# Patient Record
Sex: Male | Born: 1955
Health system: Southern US, Community
[De-identification: ages and names within clinical notes are randomized; demographics above are authoritative.]

---

## 2004-10-21 ENCOUNTER — Ambulatory Visit (HOSPITAL_COMMUNITY): Admission: RE | Admit: 2004-10-21 | Discharge: 2004-10-21 | Payer: Self-pay | Admitting: Internal Medicine

## 2006-04-29 ENCOUNTER — Encounter: Admission: RE | Admit: 2006-04-29 | Discharge: 2006-04-29 | Payer: Self-pay | Admitting: Internal Medicine

## 2006-10-07 ENCOUNTER — Emergency Department (HOSPITAL_COMMUNITY): Admission: EM | Admit: 2006-10-07 | Discharge: 2006-10-07 | Payer: Self-pay | Admitting: Family Medicine

## 2007-03-23 ENCOUNTER — Encounter: Admission: RE | Admit: 2007-03-23 | Discharge: 2007-03-23 | Payer: Self-pay | Admitting: Occupational Medicine

## 2008-03-11 ENCOUNTER — Emergency Department (HOSPITAL_COMMUNITY): Admission: EM | Admit: 2008-03-11 | Discharge: 2008-03-11 | Payer: Self-pay | Admitting: Emergency Medicine

## 2008-07-08 ENCOUNTER — Emergency Department (HOSPITAL_COMMUNITY): Admission: EM | Admit: 2008-07-08 | Discharge: 2008-07-08 | Payer: Self-pay | Admitting: Family Medicine

## 2008-11-28 ENCOUNTER — Emergency Department (HOSPITAL_COMMUNITY): Admission: EM | Admit: 2008-11-28 | Discharge: 2008-11-29 | Payer: Self-pay | Admitting: Emergency Medicine

## 2009-05-21 ENCOUNTER — Emergency Department (HOSPITAL_COMMUNITY): Admission: EM | Admit: 2009-05-21 | Discharge: 2009-05-21 | Payer: Self-pay | Admitting: Family Medicine

## 2009-06-07 ENCOUNTER — Emergency Department (HOSPITAL_COMMUNITY): Admission: EM | Admit: 2009-06-07 | Discharge: 2009-06-07 | Payer: Self-pay | Admitting: Emergency Medicine

## 2010-08-19 ENCOUNTER — Inpatient Hospital Stay (INDEPENDENT_AMBULATORY_CARE_PROVIDER_SITE_OTHER)
Admission: RE | Admit: 2010-08-19 | Discharge: 2010-08-19 | Disposition: A | Discharge status: Home / Self Care | Payer: 59 | Source: Ambulatory Visit | Attending: Emergency Medicine | Admitting: Emergency Medicine

## 2010-08-19 DIAGNOSIS — M545 Low back pain, unspecified: Secondary | ICD-10-CM

## 2010-08-19 DIAGNOSIS — R040 Epistaxis: Secondary | ICD-10-CM

## 2010-08-19 DIAGNOSIS — R1013 Epigastric pain: Secondary | ICD-10-CM

## 2010-09-21 LAB — URINALYSIS, ROUTINE W REFLEX MICROSCOPIC
Glucose, UA: NEGATIVE mg/dL
Hgb urine dipstick: NEGATIVE
Specific Gravity, Urine: 1.025 (ref 1.005–1.030)
Urobilinogen, UA: 1 mg/dL (ref 0.0–1.0)
pH: 7 (ref 5.0–8.0)

## 2010-09-21 LAB — DIFFERENTIAL
Basophils Absolute: 0 10*3/uL (ref 0.0–0.1)
Basophils Relative: 0 % (ref 0–1)
Eosinophils Absolute: 0 10*3/uL (ref 0.0–0.7)
Eosinophils Relative: 1 % (ref 0–5)
Lymphocytes Relative: 6 % — ABNORMAL LOW (ref 12–46)

## 2010-09-21 LAB — COMPREHENSIVE METABOLIC PANEL
ALT: 21 U/L (ref 0–53)
AST: 32 U/L (ref 0–37)
CO2: 29 mEq/L (ref 19–32)
Chloride: 104 mEq/L (ref 96–112)
Creatinine, Ser: 1.06 mg/dL (ref 0.4–1.5)
GFR calc Af Amer: >60 mL/min (ref >60)
GFR calc non Af Amer: >60 mL/min (ref >60)
Total Bilirubin: 2.7 mg/dL — ABNORMAL HIGH (ref 0.3–1.2)

## 2010-09-21 LAB — CBC
HCT: 42.5 % (ref 39.0–52.0)
MCV: 93.9 fL (ref 78.0–100.0)
RBC: 4.52 MIL/uL (ref 4.22–5.81)
WBC: 5.2 10*3/uL (ref 4.0–10.5)

## 2010-09-30 ENCOUNTER — Inpatient Hospital Stay (INDEPENDENT_AMBULATORY_CARE_PROVIDER_SITE_OTHER)
Admission: RE | Admit: 2010-09-30 | Discharge: 2010-09-30 | Disposition: A | Discharge status: Home / Self Care | Payer: 59 | Source: Ambulatory Visit | Attending: Family Medicine | Admitting: Family Medicine

## 2010-09-30 DIAGNOSIS — L03019 Cellulitis of unspecified finger: Secondary | ICD-10-CM

## 2010-10-02 LAB — CULTURE, ROUTINE-ABSCESS: Gram Stain: NONE SEEN

## 2010-10-05 LAB — POCT URINALYSIS DIP (DEVICE)
Hgb urine dipstick: NEGATIVE
Ketones, ur: NEGATIVE mg/dL
Protein, ur: 30 mg/dL — AB
Specific Gravity, Urine: 1.015 (ref 1.005–1.030)
pH: 7.5 (ref 5.0–8.0)

## 2010-11-06 ENCOUNTER — Ambulatory Visit: Payer: Self-pay

## 2010-11-06 ENCOUNTER — Other Ambulatory Visit: Payer: Self-pay | Admitting: Occupational Medicine

## 2010-11-06 DIAGNOSIS — R52 Pain, unspecified: Secondary | ICD-10-CM

## 2011-03-22 LAB — URINALYSIS, ROUTINE W REFLEX MICROSCOPIC
Bilirubin Urine: NEGATIVE
Glucose, UA: NEGATIVE
Ketones, ur: NEGATIVE
pH: 6

## 2015-03-27 ENCOUNTER — Emergency Department (INDEPENDENT_AMBULATORY_CARE_PROVIDER_SITE_OTHER)
Admission: EM | Admit: 2015-03-27 | Discharge: 2015-03-27 | Disposition: A | Discharge status: Home / Self Care | Payer: PRIVATE HEALTH INSURANCE | Source: Home / Self Care | Attending: Family Medicine | Admitting: Family Medicine

## 2015-03-27 ENCOUNTER — Emergency Department (INDEPENDENT_AMBULATORY_CARE_PROVIDER_SITE_OTHER): Payer: PRIVATE HEALTH INSURANCE

## 2015-03-27 ENCOUNTER — Encounter (HOSPITAL_COMMUNITY): Payer: Self-pay | Admitting: Emergency Medicine

## 2015-03-27 DIAGNOSIS — S93402A Sprain of unspecified ligament of left ankle, initial encounter: Secondary | ICD-10-CM

## 2015-03-27 NOTE — ED Provider Notes (Signed)
CSN: [id number]     Arrival date & time 03/27/15  1640 History   First MD Initiated Contact with Patient 03/27/15 1721     Chief Complaint  Patient presents with  . Ankle Pain   (Consider location/radiation/quality/duration/timing/severity/associated sxs/prior Treatment) HPI Comments: 59 year old male was pulling a linen cart when he twisted his left ankle. He is complaining of pain primarily just posterior and her to the left lateral malleolus. He is bearing full weight. This occurred at 2:30 PM today at work.   History reviewed. No pertinent past medical history. History reviewed. No pertinent past surgical history. No family history on file. Social History  Substance Use Topics  . Smoking status: Never Smoker   . Smokeless tobacco: None  . Alcohol Use: No    Review of Systems  Constitutional: Negative.   Respiratory: Negative.   Gastrointestinal: Negative.   Musculoskeletal: Negative for back pain, joint swelling, neck pain and neck stiffness.  Skin: Negative.   Neurological: Negative.     Allergies  Review of patient's allergies indicates no known allergies.  Home Medications   Prior to Admission medications   Not on File   Meds Ordered and Administered this Visit  Medications - No data to display  BP 135/82 mmHg  Pulse 65  Temp(Src) 97.7 F (36.5 C) (Oral)  Resp 16  SpO2 97% No data found.   Physical Exam  Constitutional: He is oriented to person, place, and time. He appears well-developed and well-nourished. No distress.  Eyes: EOM are normal.  Neck: Normal range of motion. Neck supple.  Cardiovascular: Normal rate.   Pulmonary/Chest: Effort normal. No respiratory distress.  Musculoskeletal: He exhibits no edema.  ROM nl.  Tenderness to the the calcanofibular ligament and tenderness superior to the lateral malleolus No deformity or bony tenderness. Minor pufffiness distal to the lat malleolus. Foot without tenderness or swelling.No tenderness,  swelling, discoloration or pain to the achilles tendon.Plantar flexion and dorsiflexion intact. Distal neurovascular motor sensory is intact pedal pulse 2+.  Neurological: He is alert and oriented to person, place, and time. He exhibits normal muscle tone.  Skin: Skin is warm and dry.  Psychiatric: He has a normal mood and affect.  Nursing note and vitals reviewed.   ED Course  Procedures (including critical care time)  Labs Review Labs Reviewed - No data to display  Imaging Review Dg Ankle Complete Left  03/27/2015   CLINICAL DATA:  Blunt trauma to the posterior heel with pain, initial encounter  EXAM: LEFT ANKLE COMPLETE - 3+ VIEW  COMPARISON:  None.  FINDINGS: No acute fracture or dislocation is noted. Diffuse vascular calcifications are seen. A small Achilles spur is noted.  IMPRESSION: No acute abnormality noted.   Electronically Signed   By: Inez Catalina M.D.   On: 03/27/2015 18:07     Visual Acuity Review  Right Eye Distance:   Left Eye Distance:   Bilateral Distance:    Right Eye Near:   Left Eye Near:    Bilateral Near:         MDM   1. Ankle sprain, left, initial encounter    NO heavy pulling, pushing wt lifting for 3 days. RICE ASO wrap    Janne Napoleon, NP 03/27/15 Bosie Helper  Janne Napoleon, NP 03/27/15 1840

## 2015-03-27 NOTE — ED Notes (Signed)
Patient reports getting hurt at work.  Linen cart run over the side of his feet, including the ankle

## 2015-03-27 NOTE — Discharge Instructions (Signed)
Ankle Sprain  An ankle sprain is an injury to the strong, fibrous tissues (ligaments) that hold the bones of your ankle joint together.   CAUSES  An ankle sprain is usually caused by a fall or by twisting your ankle. Ankle sprains most commonly occur when you step on the outer edge of your foot, and your ankle turns inward. People who participate in sports are more prone to these types of injuries.   SYMPTOMS    Pain in your ankle. The pain may be present at rest or only when you are trying to stand or walk.   Swelling.   Bruising. Bruising may develop immediately or within 1 to 2 days after your injury.   Difficulty standing or walking, particularly when turning corners or changing directions.  DIAGNOSIS   Your caregiver will ask you details about your injury and perform a physical exam of your ankle to determine if you have an ankle sprain. During the physical exam, your caregiver will press on and apply pressure to specific areas of your foot and ankle. Your caregiver will try to move your ankle in certain ways. An X-ray exam may be done to be sure a bone was not broken or a ligament did not separate from one of the bones in your ankle (avulsion fracture).   TREATMENT   Certain types of braces can help stabilize your ankle. Your caregiver can make a recommendation for this. Your caregiver may recommend the use of medicine for pain. If your sprain is severe, your caregiver may refer you to a surgeon who helps to restore function to parts of your skeletal system (orthopedist) or a physical therapist.  HOME CARE INSTRUCTIONS    Apply ice to your injury for 1-2 days or as directed by your caregiver. Applying ice helps to reduce inflammation and pain.    Put ice in a plastic bag.    Place a towel between your skin and the bag.    Leave the ice on for 15-20 minutes at a time, every 2 hours while you are awake.   Only take over-the-counter or prescription medicines for pain, discomfort, or fever as directed by  your caregiver.   Elevate your injured ankle above the level of your heart as much as possible for 2-3 days.   If your caregiver recommends crutches, use them as instructed. Gradually put weight on the affected ankle. Continue to use crutches or a cane until you can walk without feeling pain in your ankle.   If you have a plaster splint, wear the splint as directed by your caregiver. Do not rest it on anything harder than a pillow for the first 24 hours. Do not put weight on it. Do not get it wet. You may take it off to take a shower or bath.   You may have been given an elastic bandage to wear around your ankle to provide support. If the elastic bandage is too tight (you have numbness or tingling in your foot or your foot becomes cold and blue), adjust the bandage to make it comfortable.   If you have an air splint, you may blow more air into it or let air out to make it more comfortable. You may take your splint off at night and before taking a shower or bath. Wiggle your toes in the splint several times per day to decrease swelling.  SEEK MEDICAL CARE IF:    You have rapidly increasing bruising or swelling.   Your toes feel   extremely cold or you lose feeling in your foot.   Your pain is not relieved with medicine.  SEEK IMMEDIATE MEDICAL CARE IF:   Your toes are numb or blue.   You have severe pain that is increasing.  MAKE SURE YOU:    Understand these instructions.   Will watch your condition.   Will get help right away if you are not doing well or get worse.     This information is not intended to replace advice given to you by your health care provider. Make sure you discuss any questions you have with your health care provider.     Document Released: 06/07/2005 Document Revised: 06/28/2014 Document Reviewed: 06/19/2011  Elsevier Interactive Patient Education 2016 [employer]

## 2015-05-12 ENCOUNTER — Ambulatory Visit: Payer: PRIVATE HEALTH INSURANCE | Attending: Orthopedic Surgery | Admitting: Physical Therapy

## 2015-05-28 ENCOUNTER — Ambulatory Visit: Payer: PRIVATE HEALTH INSURANCE | Attending: Orthopedic Surgery | Admitting: Physical Therapy

## 2015-05-28 DIAGNOSIS — M25473 Effusion, unspecified ankle: Secondary | ICD-10-CM

## 2015-05-28 DIAGNOSIS — R29898 Other symptoms and signs involving the musculoskeletal system: Secondary | ICD-10-CM | POA: Diagnosis present

## 2015-05-28 DIAGNOSIS — M6281 Muscle weakness (generalized): Secondary | ICD-10-CM | POA: Insufficient documentation

## 2015-05-28 DIAGNOSIS — M25572 Pain in left ankle and joints of left foot: Secondary | ICD-10-CM | POA: Insufficient documentation

## 2015-05-28 DIAGNOSIS — R609 Edema, unspecified: Secondary | ICD-10-CM | POA: Diagnosis present

## 2015-05-28 DIAGNOSIS — R269 Unspecified abnormalities of gait and mobility: Secondary | ICD-10-CM | POA: Insufficient documentation

## 2015-05-28 DIAGNOSIS — R531 Weakness: Secondary | ICD-10-CM

## 2015-05-28 DIAGNOSIS — M25673 Stiffness of unspecified ankle, not elsewhere classified: Secondary | ICD-10-CM

## 2015-05-28 NOTE — Therapy (Signed)
[place], [place], [address] Phone: [phone]   Fax:  [phone]  Physical Therapy Evaluation  Patient Details  Name: David Waters MRN: [id number] Date of Birth: Feb 08, 1956 Referring Provider: Kathalene Frames. Mayer Camel, MD  Encounter Date: 05/28/2015      PT End of Session - 05/28/15 1657    Visit Number 1   Number of Visits 12   Date for PT Re-Evaluation 07/23/15   PT Start Time 1603   PT Stop Time 1649   PT Time Calculation (min) 46 min   Activity Tolerance Patient tolerated treatment well   Behavior During Therapy [facility] for tasks assessed/performed      History reviewed. No pertinent past medical history.  History reviewed. No pertinent past surgical history.  There were no vitals filed for this visit.  Visit Diagnosis:  Decreased range of motion of ankle - Plan: PT plan of care cert/re-cert  Decreased strength - Plan: PT plan of care cert/re-cert  Abnormality of gait - Plan: PT plan of care cert/re-cert  Pain in joint, ankle and foot, left - Plan: PT plan of care cert/re-cert  Ankle edema - Plan: PT plan of care cert/re-cert      Subjective Assessment - 05/28/15 1611    Subjective At work was bringing down a cart of dirty laundry which rolled over his Lt ankle (March 26, 2014). Did get X-rays which did not show any breaks. Off work for now but will go back to the doctor on 06/24/15.    Limitations Standing   How long can you sit comfortably? unlimited   How long can you stand comfortably? 1 hour   How long can you walk comfortably? 1 hour   Diagnostic tests X-rays, negative per patient report   Patient Stated Goals get back to work and walking.    Currently in Pain? Yes   Pain Score 2    Pain Location Ankle   Pain Orientation Left   Pain Descriptors / Indicators Sore   Pain Type Chronic pain   Pain Radiating Towards none   Pain Onset More than a month ago   Pain Frequency Constant   Aggravating Factors  Lying on Lt side, standing for long time.   Pain Relieving Factors nothing            [facility] PT Assessment - 05/28/15 0001    Assessment   Medical Diagnosis Left ankle and heel pain   Referring Provider Kathalene Frames. Mayer Camel, MD   Onset Date/Surgical Date 03/27/15   Next MD Visit 06/24/15   Prior Therapy none   Precautions   Precautions None   Restrictions   Weight Bearing Restrictions No   Balance Screen   Has the patient fallen in the past 6 months No   [place] residence   Living Arrangements Other (Comment)  family   Prior Function   Level of Independence Independent   Vocation Full time employment   Vocation Requirements Walking, pushing/pulling   Cognition   Overall Cognitive Status Within Functional Limits for tasks assessed   Observation/Other Assessments   Observations Using CAM boot, pes planus on Lt   Observation/Other Assessments-Edema    Edema --  mild at posterior malleolus   Sensation   Light Touch Appears Intact   AROM   Left Ankle Dorsiflexion 3   Left Ankle Plantar Flexion 22   Left Ankle Inversion 24   Left Ankle Eversion 4  pain  Strength   Left Ankle Dorsiflexion 4+/5   Left Ankle Plantar Flexion 4/5   Left Ankle Inversion 4-/5   Left Ankle Eversion 4-/5   Palpation   Palpation comment Tender at posterior malleolus, no discomfort at anterior aspect or achilles   Ambulation/Gait   Ambulation/Gait Yes   Ambulation/Gait Assistance 6: Modified independent (Device/Increase time);Other (comment)  CAM boot   Ambulation Distance (Feet) 100 Feet   Assistive device None   Gait Pattern Decreased weight shift to left;Decreased stance time - left   Ambulation Surface Level                   [facility] Adult PT Treatment/Exercise - 05/28/15 0001    Ankle Exercises: Stretches   Gastroc Stretch 5 reps;20 seconds   Other Stretch active plantar flexion, eversion, inversion, Lt 1X10 each  direction.                 PT Education - 05/28/15 1656    Education provided Yes   Education Details General POC and HEP.    Person(s) Educated Patient   Methods Explanation;Demonstration;Tactile cues;Verbal cues;Handout   Comprehension Verbalized understanding;Returned demonstration          PT Short Term Goals - 05/28/15 1710    PT SHORT TERM GOAL #1   Title Patient to be independent with HEP for ROM and strengtheinging for LLE.    Time 3   Period Weeks   Status New   PT SHORT TERM GOAL #2   Title Patient to demonstrate 10 degrees of dorsiflexion on the Lt. for gait mechanics.    Time 3   Period Weeks   Status New   PT SHORT TERM GOAL #3   Title Patient to rate his pain as less than or equal to 1/10 with activity.    Time 3   Period Weeks   Status New           PT Long Term Goals - 05/28/15 1705    PT LONG TERM GOAL #1   Title Patient to ambulate without boot and without gait deviation for return to work.    Time 6   Period Weeks   Status New   PT LONG TERM GOAL #2   Title Patient to demonstrate 5/5 strength throughout Lt ankle in all planes for stability on uneven surfaces.    Time 6   Period Weeks   Status New   PT LONG TERM GOAL #3   Title Patient to reports ability to return to work without restrictions.    Time 6   Period Weeks   Status New   PT LONG TERM GOAL #4   Title Patient to demonstrate 15 degrees Lt ankle dorsiflexion for proper gait mechanics and ankle arthrokinematics.    Time 6   Period Weeks   Status New   PT LONG TERM GOAL #5   Period Weeks               Plan - 05/28/15 1658    Clinical Impression Statement Patient is a 59 y.o. male who describes rolling over his left ankle while moving a laundry cart on 03/27/15. At this time the patient is presenting with decreased Lt ankle ROM as well as decreased pain and pain at the lateral ankle. Functionally this is impacting his ability to stand and ambulate which has also  restricted him from returning to work. The patient is appropriate for ongoing PT sessoins to address these areas for incresed  function and return to work.    Pt will benefit from skilled therapeutic intervention in order to improve on the following deficits Abnormal gait;Decreased activity tolerance;Decreased balance;Decreased range of motion;Difficulty walking;Decreased mobility;Decreased strength   Rehab Potential Good   PT Frequency 2x / week   PT Duration 6 weeks   PT Treatment/Interventions ADLs/Self Care Home Management;Iontophoresis 4mg /ml Dexamethasone;Moist Heat;Balance training;Therapeutic exercise;Therapeutic activities;Functional mobility training;Gait training;Stair training;Ultrasound;Patient/family education;Manual techniques;Passive range of motion;Vasopneumatic Device   PT Next Visit Plan Review HEP and progress to gently isometrics if appropriate. Work on stretches for dorsiflexion and active motion into EV/IV.    PT Home Exercise Plan Assess response to isometrics, add if appropriate   Consulted and Agree with Plan of Care Patient         Problem List There are no active problems to display for this patient.   Linard Millers, PT 05/28/2015, 5:17 PM  [facility] [address] [place], [place], [address] Phone: [phone]   Fax:  [phone]  Name: DAVIDE HENSLEE MRN: [id number] Date of Birth: 06-10-1956

## 2015-06-04 ENCOUNTER — Ambulatory Visit: Payer: PRIVATE HEALTH INSURANCE | Attending: Orthopedic Surgery | Admitting: Physical Therapy

## 2015-06-04 DIAGNOSIS — R29898 Other symptoms and signs involving the musculoskeletal system: Secondary | ICD-10-CM | POA: Insufficient documentation

## 2015-06-04 DIAGNOSIS — R531 Weakness: Secondary | ICD-10-CM

## 2015-06-04 DIAGNOSIS — M25572 Pain in left ankle and joints of left foot: Secondary | ICD-10-CM | POA: Diagnosis present

## 2015-06-04 DIAGNOSIS — M25673 Stiffness of unspecified ankle, not elsewhere classified: Secondary | ICD-10-CM

## 2015-06-04 DIAGNOSIS — R269 Unspecified abnormalities of gait and mobility: Secondary | ICD-10-CM | POA: Insufficient documentation

## 2015-06-04 DIAGNOSIS — R609 Edema, unspecified: Secondary | ICD-10-CM | POA: Diagnosis present

## 2015-06-04 DIAGNOSIS — M25473 Effusion, unspecified ankle: Secondary | ICD-10-CM

## 2015-06-04 DIAGNOSIS — M6281 Muscle weakness (generalized): Secondary | ICD-10-CM | POA: Insufficient documentation

## 2015-06-04 NOTE — Therapy (Signed)
[place], [place], [address] Phone: [phone]   Fax:  [phone]  Physical Therapy Treatment  Patient Details  Name: David Waters MRN: [id number] Date of Birth: 1956/03/30 Referring Provider: Kathalene Frames. Mayer Camel, MD  Encounter Date: 06/04/2015      PT End of Session - 06/04/15 1545    Visit Number 2   Number of Visits 12   Date for PT Re-Evaluation 07/23/15   PT Start Time 0343   PT Stop Time 0423   PT Time Calculation (min) 40 min      No past medical history on file.  No past surgical history on file.  There were no vitals filed for this visit.  Visit Diagnosis:  Decreased range of motion of ankle  Decreased strength  Abnormality of gait  Pain in joint, ankle and foot, left  Ankle edema      Subjective Assessment - 06/04/15 1544    Subjective The pain is going away now.    Currently in Pain? No/denies            [facility] PT Assessment - 06/04/15 1554    AROM   Left Ankle Dorsiflexion 10   Left Ankle Plantar Flexion 50                     [facility] Adult PT Treatment/Exercise - 06/04/15 1558    Modalities   Modalities Iontophoresis   Iontophoresis   Type of Iontophoresis Dexamethasone   Location L lateral ankle    Dose 1.0 cc   Time 4-6 hours   Ankle Exercises: Stretches   Gastroc Stretch 2 reps;30 seconds  towel stretch   Ankle Exercises: Seated   Ankle Circles/Pumps 10 reps   Heel Raises 20 reps   Toe Raise 20 reps   Other Seated Ankle Exercises AROM Inv/EV x10 x 3-began to increase pain   Other Seated Ankle Exercises isometric 4 way ankle with good strength noted in PF and DF   Ankle Exercises: Supine   T-Band yellow band for PF/DF   Other Supine Ankle Exercises isometrics for EV/Inv  seated and supine for variation on HEP                PT Education - 06/04/15 1605    Education provided Yes   Education Details Ankel isometrics for EV/INV;  yellow band for PF/DF; Iontophoresis patch-remove if bothersome    Person(s) Educated Patient   Methods Explanation;Handout   Comprehension Verbalized understanding          PT Short Term Goals - 06/04/15 1546    PT SHORT TERM GOAL #1   Title Patient to be independent with HEP for ROM and strengtheinging for LLE.    Time 3   Period Weeks   Status On-going   PT SHORT TERM GOAL #2   Title Patient to demonstrate 10 degrees of dorsiflexion on the Lt. for gait mechanics.    Time 3   Period Weeks   Status On-going   PT SHORT TERM GOAL #3   Title Patient to rate his pain as less than or equal to 1/10 with activity.    Time 3   Period Weeks   Status On-going           PT Long Term Goals - 05/28/15 1705    PT LONG TERM GOAL #1   Title Patient to ambulate without boot and without gait deviation for return to work.  Time 6   Period Weeks   Status New   PT LONG TERM GOAL #2   Title Patient to demonstrate 5/5 strength throughout Lt ankle in all planes for stability on uneven surfaces.    Time 6   Period Weeks   Status New   PT LONG TERM GOAL #3   Title Patient to reports ability to return to work without restrictions.    Time 6   Period Weeks   Status New   PT LONG TERM GOAL #4   Title Patient to demonstrate 15 degrees Lt ankle dorsiflexion for proper gait mechanics and ankle arthrokinematics.    Time 6   Period Weeks   Status New   PT LONG TERM GOAL #5   Period Weeks               Plan - 06/04/15 1623    Clinical Impression Statement Pt presents with reports of decreased pain since beginning HEP. Pain begins with repetitive Inv/Eversion AROM. Instructed pt in isometrics for HEP as well as yellow band for PF/DF. Pt's DF and PF have improved. Will measure In/ev next visit which have visually improved as well. Began Iontophoresis to lateral ankle at point of tenderness as POC signed by MD.    PT Next Visit Plan Review new HEP, cont Ionto if tolerated.          Problem List There are no active problems to display for this patient.   Dorene Ar, [place]  06/04/2015, 4:27 PM  [place], [place], [address] Phone: [phone]   Fax:  [phone]  Name: JOSMAR SAWIN MRN: [id number] Date of Birth: January 01, 1956

## 2015-06-04 NOTE — Patient Instructions (Addendum)
  Inversion (Isometric)    Sitting, press inside of right foot against stable furniture, without ankle movement. Hold 5 sec Repeat _15___ times. Do _2___ sessions per day.  http://gt2.exer.us/433   Copyright  VHI. All rights reserved.   Eversion: Isometric    Press outer border of right foot into ball or rolled pillow against wall. Hold __5__ seconds. Relax. Repeat __15__ times per set. Do __2__ sets per session. Do _2___ sessions per day.  http://orth.exer.us/4   Copyright  VHI. All rights reserved.   Plantar Flexion: Resisted   Anchor behind, tubing around left foot, press down. Repeat _15___ times per set. Do _2___ sets per session. Do __2__ sessions per day.  http://orth.exer.us/10   Copyright  VHI. All rights reserved.  Dorsiflexion: Resisted   Facing anchor, tubing around left foot, pull toward face.  Repeat __15__ times per set. Do __2__ sets per session. Do _2___ sessions per day.  http://orth.exer.us/8   Copyright  VHI. All rights reserved.  IONTOPHORESIS PATIENT PRECAUTIONS & CONTRAINDICATIONS:  . Redness under one or both electrodes can occur.  This characterized by a uniform redness that usually disappears within 12 hours of treatment. . Small pinhead size blisters may result in response to the drug.  Contact your physician if the problem persists more than 24 hours. . On rare occasions, iontophoresis therapy can result in temporary skin reactions such as rash, inflammation, irritation or burns.  The skin reactions may be the result of individual sensitivity to the ionic solution used, the condition of the skin at the start of treatment, reaction to the materials in the electrodes, allergies or sensitivity to dexamethasone, or a poor connection between the patch and your skin.  Discontinue using iontophoresis if you have any of these reactions and report to your therapist. . Remove the Patch or electrodes if you have any undue sensation of pain or burning  during the treatment and report discomfort to your therapist. . Tell your Therapist if you have had known adverse reactions to the application of electrical current. . If using the Patch, the LED light will turn off when treatment is complete and the patch can be removed.  Approximate treatment time is 1-3 hours.  Remove the patch when light goes off or after 6 hours. . The Patch can be worn during normal activity, however excessive motion where the electrodes have been placed can cause poor contact between the skin and the electrode or uneven electrical current resulting in greater risk of skin irritation. [place] [facility] Keep out of the reach of children.   . DO NOT use if you have a cardiac pacemaker or any other electrically sensitive implanted device. . DO NOT use if you have a known sensitivity to dexamethasone. . DO NOT use during Magnetic Resonance Imaging (MRI). . DO NOT use over broken or compromised skin (e.g. sunburn, cuts, or acne) due to the increased risk of skin reaction. . DO NOT SHAVE over the area to be treated:  To establish good contact between the Patch and the skin, excessive hair may be clipped. . DO NOT place the Patch or electrodes on or over your eyes, directly over your heart, or brain. . DO NOT reuse the Patch or electrodes as this may cause burns to occur.

## 2015-06-05 ENCOUNTER — Ambulatory Visit: Payer: PRIVATE HEALTH INSURANCE | Admitting: Physical Therapy

## 2015-06-05 DIAGNOSIS — M25572 Pain in left ankle and joints of left foot: Secondary | ICD-10-CM

## 2015-06-05 DIAGNOSIS — R531 Weakness: Secondary | ICD-10-CM

## 2015-06-05 DIAGNOSIS — R29898 Other symptoms and signs involving the musculoskeletal system: Secondary | ICD-10-CM | POA: Diagnosis not present

## 2015-06-05 DIAGNOSIS — R269 Unspecified abnormalities of gait and mobility: Secondary | ICD-10-CM

## 2015-06-05 DIAGNOSIS — M25473 Effusion, unspecified ankle: Secondary | ICD-10-CM

## 2015-06-05 DIAGNOSIS — M25673 Stiffness of unspecified ankle, not elsewhere classified: Secondary | ICD-10-CM

## 2015-06-05 NOTE — Therapy (Signed)
[place], [place], [address] Phone: [phone]   Fax:  [phone]  Physical Therapy Treatment  Patient Details  Name: David Waters MRN: [id number] Date of Birth: [birth year] Referring Leonda Cristo: Kathalene Frames. Mayer Camel, MD  Encounter Date: 06/05/2015      PT End of Session - 06/05/15 1654    Visit Number 3   Number of Visits 12   Date for PT Re-Evaluation 07/23/15   PT Start Time 0350   PT Stop Time 0430   PT Time Calculation (min) 40 min      No past medical history on file.  No past surgical history on file.  There were no vitals filed for this visit.  Visit Diagnosis:  Decreased range of motion of ankle  Decreased strength  Abnormality of gait  Pain in joint, ankle and foot, left  Ankle edema      Subjective Assessment - 06/05/15 1719    Subjective No pain.    Currently in Pain? No/denies                         [facility] Adult PT Treatment/Exercise - 06/05/15 1649    Modalities   Modalities Iontophoresis   Iontophoresis   Type of Iontophoresis Dexamethasone   Location L lateral ankle    Dose 1.0 cc   Time 4-6 hours   Ankle Exercises: Stretches   Gastroc Stretch 2 reps;30 seconds  towel stretch   Ankle Exercises: Seated   Ankle Circles/Pumps 10 reps   Heel Raises 20 reps   Toe Raise 20 reps   BAPS Sitting;Level 2  PF/DF circles each way no pain   Other Seated Ankle Exercises AROM INV/EV -no pain today   Other Seated Ankle Exercises isometrics 5 sec x 10 INv/ev -no pain   Ankle Exercises: Supine   T-Band yellow band 4 way and green band PF/DF                PT Education - 06/04/15 1605    Education provided Yes   Education Details Ankel isometrics for EV/INV; yellow band for PF/DF; Iontophoresis patch-remove if bothersome    Person(s) Educated Patient   Methods Explanation;Handout   Comprehension Verbalized understanding          PT Short Term  Goals - 06/04/15 1546    PT SHORT TERM GOAL #1   Title Patient to be independent with HEP for ROM and strengtheinging for LLE.    Time 3   Period Weeks   Status On-going   PT SHORT TERM GOAL #2   Title Patient to demonstrate 10 degrees of dorsiflexion on the Lt. for gait mechanics.    Time 3   Period Weeks   Status On-going   PT SHORT TERM GOAL #3   Title Patient to rate his pain as less than or equal to 1/10 with activity.    Time 3   Period Weeks   Status On-going           PT Long Term Goals - 05/28/15 1705    PT LONG TERM GOAL #1   Title Patient to ambulate without boot and without gait deviation for return to work.    Time 6   Period Weeks   Status New   PT LONG TERM GOAL #2   Title Patient to demonstrate 5/5 strength throughout Lt ankle in all planes for stability on uneven surfaces.    Time  6   Period Weeks   Status New   PT LONG TERM GOAL #3   Title Patient to reports ability to return to work without restrictions.    Time 6   Period Weeks   Status New   PT LONG TERM GOAL #4   Title Patient to demonstrate 15 degrees Lt ankle dorsiflexion for proper gait mechanics and ankle arthrokinematics.    Time 6   Period Weeks   Status New   PT LONG TERM GOAL #5   Period Weeks               Plan - 06/05/15 1716    Clinical Impression Statement Pt reports decreased pain today after last treatment and wearing ionto patch last evening. No increased pain with repeated AROM motions. Progressed to seated Baps and resisted 4 way ankle without increased pain. Pt given green band for PF/DF- will begin yellow band for Inversion/eversion.    PT Next Visit Plan continue IONTO, progress inv/evsion exercises as tolerated, measure inv/ev        Problem List There are no active problems to display for this patient.   Dorene Ar, [place] 06/05/2015, 5:19 PM  [place], [place],  [address] Phone: [phone]   Fax:  [phone]  Name: David Waters MRN: [id number] Date of Birth: [birth year]

## 2015-06-09 ENCOUNTER — Ambulatory Visit: Payer: PRIVATE HEALTH INSURANCE | Admitting: Physical Therapy

## 2015-06-09 DIAGNOSIS — R531 Weakness: Secondary | ICD-10-CM

## 2015-06-09 DIAGNOSIS — M25473 Effusion, unspecified ankle: Secondary | ICD-10-CM

## 2015-06-09 DIAGNOSIS — M25572 Pain in left ankle and joints of left foot: Secondary | ICD-10-CM

## 2015-06-09 DIAGNOSIS — R29898 Other symptoms and signs involving the musculoskeletal system: Secondary | ICD-10-CM | POA: Diagnosis not present

## 2015-06-09 DIAGNOSIS — R269 Unspecified abnormalities of gait and mobility: Secondary | ICD-10-CM

## 2015-06-09 DIAGNOSIS — M25673 Stiffness of unspecified ankle, not elsewhere classified: Secondary | ICD-10-CM

## 2015-06-09 NOTE — Patient Instructions (Signed)
Gastroc / Heel Cord Stretch - On Step    Stand with heels over edge of stair. Holding rail, lower heels until stretch is felt in calf of legs for 30 sec Repeat _3__ times. Do __3_ times per day.  Copyright  VHI. All rights reserved.   PLANTARFLEXION STRENGTHENING:   Heel Raise: Bilateral (Standing)   Rise on balls of feet. Repeat _10___ times per set. Do _2___ sets per session. Do _2___ sessions per day.        DORSIFLEXION STRENGTHENING:  Toe Raise (Standing)   Rock back on heels. Repeat ____ times per set. Do ____ sets per session. Do ____ sessions per day.  http://orth.exer.us/42    SINGLE LIMB STANCE   Stance: single leg on floor. Raise leg. Hold __60_ seconds.  _2__ reps per set, _1__ sets per day, __7_ days per week  Copyright  VHI. All rights reserved.   Supine Hamstring Stretch    Lift one leg, placing hands behind thigh. Gently pull leg toward torso until a stretch is felt. Other leg is bent and back is flat against floor. Repeat with other leg. Hold 3 seconds each Repeat _3___ times. Do _3___ sessions per day.  http://gt2.exer.us/828   Copyright  VHI. All rights reserved.

## 2015-06-09 NOTE — Therapy (Signed)
[place], [place], [address] Phone: [phone]   Fax:  [phone]  Physical Therapy Treatment  Patient Details  Name: David Waters MRN: [id number] Date of Birth: [birth year] Referring Provider: Kathalene Frames. Mayer Camel, MD  Encounter Date: 06/09/2015      PT End of Session - 06/09/15 1148    Visit Number 4   Number of Visits 12   Date for PT Re-Evaluation 07/23/15   PT Start Time 3474   PT Stop Time 1225   PT Time Calculation (min) 40 min      No past medical history on file.  No past surgical history on file.  There were no vitals filed for this visit.  Visit Diagnosis:  Decreased range of motion of ankle  Decreased strength  Abnormality of gait  Pain in joint, ankle and foot, left  Ankle edema      Subjective Assessment - 06/09/15 1147    Subjective This is my first day without the boot. I saw the employee heath MD Friday and they said to take the boot off starting today.    Currently in Pain? No/denies            [facility] PT Assessment - 06/09/15 1155    ROM / Strength   AROM / PROM / Strength AROM   AROM   AROM Assessment Site Ankle   Right/Left Ankle Right   Right Ankle Dorsiflexion 15   Right Ankle Plantar Flexion 55   Right Ankle Inversion 30   Right Ankle Eversion 26   Left Ankle Dorsiflexion 10   Left Ankle Plantar Flexion 55   Left Ankle Inversion 30   Left Ankle Eversion 20                     [facility] Adult PT Treatment/Exercise - 06/09/15 1155    Ambulation/Gait   Ambulation/Gait Yes   Ambulation/Gait Assistance 7: Independent   Ambulation Distance (Feet) 200 Feet   Assistive device None   Gait Pattern Step-through pattern   Ambulation Surface Level;Indoor   Stairs Yes   Stairs Assistance 7: Independent   Stair Management Technique Alternating pattern   Number of Stairs 12   Height of Stairs 6   Knee/Hip Exercises: Stretches   Active Hamstring  Stretch 3 reps;30 seconds   Ankle Exercises: Stretches   Gastroc Stretch 2 reps;30 seconds  ball of left foot on 2 inch step    Ankle Exercises: Standing   SLS left one minute   Rocker Board 2 minutes   Heel Raises 15 reps   Toe Raise 15 reps                PT Education - 06/09/15 1221    Education provided Yes   Education Details ankle strength and stretch   Person(s) Educated Patient   Methods Explanation;Handout   Comprehension Verbalized understanding          PT Short Term Goals - 06/09/15 1235    PT SHORT TERM GOAL #1   Title Patient to be independent with HEP for ROM and strengtheinging for LLE.    Time 3   Period Weeks   PT SHORT TERM GOAL #2   Title Patient to demonstrate 10 degrees of dorsiflexion on the Lt. for gait mechanics.    Time 3   Period Weeks   Status Achieved   PT SHORT TERM GOAL #3   Title Patient to rate his pain  as less than or equal to 1/10 with activity.    Time 3   Period Weeks   Status Achieved           PT Long Term Goals - 06/09/15 1239    PT LONG TERM GOAL #1   Title Patient to ambulate without boot and without gait deviation for return to work.    Time 6   Period Weeks   Status Achieved   PT LONG TERM GOAL #2   Title Patient to demonstrate 5/5 strength throughout Lt ankle in all planes for stability on uneven surfaces.    Time 6   Period Weeks   Status Unable to assess   PT LONG TERM GOAL #3   Title Patient to reports ability to return to work without restrictions.    Time 6   Period Weeks   Status On-going   PT LONG TERM GOAL #4   Title Patient to demonstrate 15 degrees Lt ankle dorsiflexion for proper gait mechanics and ankle arthrokinematics.    Time 6   Period Weeks   Status On-going               Plan - 06/09/15 1153    Clinical Impression Statement Pt enters with one flip flop and one canvas shoe. Advised pt to wear tennis shoes or other matching shoes next visit for closed chain progression.  Instructed pt in SLS which was maintained for 1 minute. Insructed in reciprcoal stair climbing with  no increased pain, only stretch felt in calf and hamstring. Also  instructed in bilateral heel and toe raises without increased pain.  AROM has greatly improved. He is slightly limited in DF and eversion compared to his right ankle. Pt given active hamstring stretch and standing DF stretch for HEP as well as SLS and heel/toe raises. Advised to stop if painful. STG# 2,3  and LTG#1 MET.    PT Next Visit Plan Review closed chain and progress as tolerated        Problem List There are no active problems to display for this patient.   Dorene Ar, [place] 06/09/2015, 12:53 PM  [place], [place], [address] Phone: [phone]   Fax:  [phone]  Name: David Waters MRN: [id number] Date of Birth: 1956/02/11

## 2015-06-11 ENCOUNTER — Ambulatory Visit: Payer: PRIVATE HEALTH INSURANCE | Admitting: Physical Therapy

## 2015-06-11 DIAGNOSIS — M25673 Stiffness of unspecified ankle, not elsewhere classified: Secondary | ICD-10-CM

## 2015-06-11 DIAGNOSIS — R29898 Other symptoms and signs involving the musculoskeletal system: Secondary | ICD-10-CM | POA: Diagnosis not present

## 2015-06-11 DIAGNOSIS — M25473 Effusion, unspecified ankle: Secondary | ICD-10-CM

## 2015-06-11 DIAGNOSIS — M25572 Pain in left ankle and joints of left foot: Secondary | ICD-10-CM

## 2015-06-11 DIAGNOSIS — R269 Unspecified abnormalities of gait and mobility: Secondary | ICD-10-CM

## 2015-06-11 DIAGNOSIS — R531 Weakness: Secondary | ICD-10-CM

## 2015-06-11 NOTE — Therapy (Signed)
[place], [place], [address] Phone: [phone]   Fax:  [phone]  Physical Therapy Treatment  Patient Details  Name: David Waters MRN: [id number] Date of Birth: October 04, 1955 Referring Provider: Kathalene Frames. Mayer Camel, MD  Encounter Date: 06/11/2015      PT End of Session - 06/11/15 1336    Visit Number 5   Number of Visits 12   Date for PT Re-Evaluation 07/23/15   Authorization Type WC   Authorization - Visit Number 5   Authorization - Number of Visits 6   PT Start Time 0132   PT Stop Time 0212   PT Time Calculation (min) 40 min      No past medical history on file.  No past surgical history on file.  There were no vitals filed for this visit.  Visit Diagnosis:  Decreased range of motion of ankle  Decreased strength  Abnormality of gait  Pain in joint, ankle and foot, left  Ankle edema      Subjective Assessment - 06/11/15 1421    Subjective No pain at all   Currently in Pain? No/denies            [facility] PT Assessment - 06/11/15 1423    Strength   Left Ankle Dorsiflexion 5/5   Left Ankle Plantar Flexion 5/5   Left Ankle Inversion 5/5   Left Ankle Eversion 5/5                     [facility] Adult PT Treatment/Exercise - 06/11/15 0001    Ankle Exercises: Standing   Rebounder L SLS with red ball tosses- no LOB added foam pad for challenge with good control   Heel Raises Other (comment)  25 single heel raises left   Other Standing Ankle Exercises 4 inch retro step ups, bosu step ups , bosu balance   Other Standing Ankle Exercises L SLS with green band way hip    Ankle Exercises: Stretches   Gastroc Stretch 2 reps;30 seconds  ball of left foot on 2 inch step    Ankle Exercises: Aerobic   Stationary Bike Rec Bike L2 x 5 min                PT Education - 06/11/15 1413    Education provided Yes   Education Details standing 4 way hip with green band   Person(s)  Educated Patient   Methods Explanation;Handout   Comprehension Verbalized understanding          PT Short Term Goals - 06/11/15 1336    PT SHORT TERM GOAL #1   Title Patient to be independent with HEP for ROM and strengtheinging for LLE.    Time 3   Period Weeks   Status Achieved   PT SHORT TERM GOAL #2   Title Patient to demonstrate 10 degrees of dorsiflexion on the Lt. for gait mechanics.    Time 3   Period Weeks   Status Achieved   PT SHORT TERM GOAL #3   Title Patient to rate his pain as less than or equal to 1/10 with activity.    Time 3   Period Weeks   Status Achieved           PT Long Term Goals - 06/11/15 1336    PT LONG TERM GOAL #1   Title Patient to ambulate without boot and without gait deviation for return to work.    Time 6  Period Weeks   Status Achieved   PT LONG TERM GOAL #2   Title Patient to demonstrate 5/5 strength throughout Lt ankle in all planes for stability on uneven surfaces.    Time 6   Period Weeks   PT LONG TERM GOAL #3   Title Patient to reports ability to return to work without restrictions.    Baseline goes back to MD jan 3rd   Time 6   Period Weeks   Status On-going   PT LONG TERM GOAL #4   Title Patient to demonstrate 15 degrees Lt ankle dorsiflexion for proper gait mechanics and ankle arthrokinematics.    Time 6   Period Weeks   Status On-going               Plan - 06/11/15 1417    Clinical Impression Statement Pt reports walking in normal shoes since last visit with no increased pain. He is walking as much as possible and has no increase pain. He can perform 25 single heel raises without increased pain. Ankle MMT 5/5 throughout. SLS activites on foam pad with good control. Instructed in SLS with 4 way hip and green band and added to HEP to further improve gait endurance and unlevel surface tolerance. Pt would like to try wearing hi dress shoes and asses tolerance.    PT Next Visit Plan  1 more covered WC visit.   review, measure and DC- did he tolerate his Sunday shoes okay?        Problem List There are no active problems to display for this patient.   Dorene Ar, [place] 06/11/2015, 2:25 PM  [place], [place], [address] Phone: [phone]   Fax:  [phone]  Name: David Waters MRN: [id number] Date of Birth: 1955/12/27

## 2015-06-11 NOTE — Patient Instructions (Signed)
Knee High   Holding stable object, raise knee to hip level, then lower knee. Repeat with other knee. Complete __10_ repetitions. Do __2__ sessions per day.  ABDUCTION: Standing (Active)   Stand, feet flat. Lift right leg out to side. Use _0__ lbs. Complete __10_ repetitions. Perform __2_ sessions per day.  ADDUCTION: Standing - Stable (Active)   Stand, right leg out to side as far as possible. Draw leg in across midline. Use _0__ lbs. Complete 10_ repetitions. Perform _2__ sessions per day.       EXTENSION: Standing (Active)  Stand, both feet flat. Draw right leg behind body as far as possible. Use 0___ lbs. Complete 10 repetitions. Perform __2_ sessions per day.  Copyright  VHI. All rights reserved.    

## 2015-06-19 ENCOUNTER — Ambulatory Visit: Payer: PRIVATE HEALTH INSURANCE | Admitting: Physical Therapy

## 2015-06-19 DIAGNOSIS — R531 Weakness: Secondary | ICD-10-CM

## 2015-06-19 DIAGNOSIS — M25673 Stiffness of unspecified ankle, not elsewhere classified: Secondary | ICD-10-CM

## 2015-06-19 DIAGNOSIS — R269 Unspecified abnormalities of gait and mobility: Secondary | ICD-10-CM

## 2015-06-19 DIAGNOSIS — R29898 Other symptoms and signs involving the musculoskeletal system: Secondary | ICD-10-CM | POA: Diagnosis not present

## 2015-06-19 DIAGNOSIS — M25572 Pain in left ankle and joints of left foot: Secondary | ICD-10-CM

## 2015-06-19 DIAGNOSIS — M25473 Effusion, unspecified ankle: Secondary | ICD-10-CM

## 2015-06-19 NOTE — Therapy (Signed)
[place], [place], [address] Phone: [phone]   Fax:  [phone]  Physical Therapy Treatment  Patient Details  Name: TEVION LAFORGE MRN: [id number] Date of Birth: [birth year] Referring Provider: Kathalene Frames. Mayer Camel, MD  Encounter Date: 06/19/2015      PT End of Session - 06/19/15 1531    Visit Number 6   Number of Visits 12   Date for PT Re-Evaluation 07/23/15   Authorization Type WC   Authorization - Visit Number 6   Authorization - Number of Visits 6   PT Start Time 1129  patient 30 minutes late for session   PT Stop Time 1142   PT Time Calculation (min) 13 min   Activity Tolerance Patient tolerated treatment well   Behavior During Therapy [facility] for tasks assessed/performed      No past medical history on file.  No past surgical history on file.  There were no vitals filed for this visit.  Visit Diagnosis:  Decreased range of motion of ankle  Decreased strength  Pain in joint, ankle and foot, left  Ankle edema  Abnormality of gait      Subjective Assessment - 06/19/15 1130    Subjective Feeling excellent now. Going to see the doctor on 06/24/15. States that he feels like he will be ready to go back to work.    How long can you sit comfortably? unlimited   How long can you stand comfortably? unlimited    How long can you walk comfortably? unlimited   Currently in Pain? No/denies            [facility] PT Assessment - 06/19/15 0001    AROM   Right Ankle Dorsiflexion 22   Strength   Left Ankle Dorsiflexion 5/5   Left Ankle Plantar Flexion 5/5   Left Ankle Inversion 5/5   Left Ankle Eversion 5/5   Palpation   Palpation comment no tenderness with palpation   High Level Balance   High Level Balance Comments SLS 45 seconds    HEP reviewed, denies questions or concerns.                  [facility] Adult PT Treatment/Exercise - 06/19/15 0001    Ambulation/Gait   Ambulation/Gait  Yes   Ambulation/Gait Assistance 7: Independent   Ambulation Distance (Feet) 300 Feet   Assistive device None   Gait Pattern Within Functional Limits   Ambulation Surface Level   Stairs Yes   Stairs Assistance 7: Independent   Stair Management Technique Alternating pattern   Number of Stairs 8   Height of Stairs 6                PT Education - 06/19/15 1357    Education provided Yes   Education Details reviewed HEP   Person(s) Educated Patient   Methods Explanation   Comprehension Verbalized understanding          PT Short Term Goals - 06/19/15 1132    PT SHORT TERM GOAL #1   Title Patient to be independent with HEP for ROM and strengtheinging for LLE.    Status Achieved   PT SHORT TERM GOAL #2   Title Patient to demonstrate 10 degrees of dorsiflexion on the Lt. for gait mechanics.    Status Achieved   PT SHORT TERM GOAL #3   Title Patient to rate his pain as less than or equal to 1/10 with activity.    Status Achieved  PT Long Term Goals - 06/19/15 1132    PT LONG TERM GOAL #1   Title Patient to ambulate without boot and without gait deviation for return to work.    Status Achieved   PT LONG TERM GOAL #2   Title Patient to demonstrate 5/5 strength throughout Lt ankle in all planes for stability on uneven surfaces.    Status Achieved   PT LONG TERM GOAL #3   Title Patient to reports ability to return to work without restrictions.    Baseline patient reports that he feels like he will be able to achive this once released, back to MD on 06/24/15   Status Partially Met   PT LONG TERM GOAL #4   Title Patient to demonstrate 15 degrees Lt ankle dorsiflexion for proper gait mechanics and ankle arthrokinematics.    Status Achieved               Plan - 06/19/15 1533    Clinical Impression Statement Patient has made excellent progress with PT and has met all of his goals. At this time the patient is going to be D/C from PT services. Patient is in  agreement with this plan.    PT Next Visit Plan none-D/C   PT Home Exercise Plan none   Consulted and Agree with Plan of Care Patient        Problem List There are no active problems to display for this patient.   Linard Millers, PT 06/19/2015, 3:35 PM  [facility] [address] [place], [place], [address] Phone: [phone]   Fax:  [phone]  Name: MADIX BLOWE MRN: [id number] Date of Birth: 10-13-1955   PHYSICAL THERAPY DISCHARGE SUMMARY  Visits from Start of Care: 6  Current functional level related to goals / functional outcomes: As above   Remaining deficits: As above   Education / Equipment: As above  Plan: Patient agrees to discharge.  Patient goals were met. Patient is being discharged due to meeting the stated rehab goals.  ?????       Cassell Clement, PT, [place] Pager [phone] Office [phone]

## 2015-06-24 ENCOUNTER — Encounter: Payer: Self-pay | Admitting: Physical Therapy

## 2015-06-26 ENCOUNTER — Encounter: Payer: Self-pay | Admitting: Physical Therapy

## 2015-06-27 ENCOUNTER — Encounter: Payer: Self-pay | Admitting: Physical Therapy

## 2015-07-18 DIAGNOSIS — I517 Cardiomegaly: Secondary | ICD-10-CM | POA: Diagnosis not present

## 2015-08-06 DIAGNOSIS — M779 Enthesopathy, unspecified: Secondary | ICD-10-CM | POA: Diagnosis not present

## 2015-08-06 DIAGNOSIS — F524 Premature ejaculation: Secondary | ICD-10-CM | POA: Diagnosis not present

## 2015-08-06 DIAGNOSIS — I739 Peripheral vascular disease, unspecified: Secondary | ICD-10-CM | POA: Diagnosis not present

## 2015-08-06 DIAGNOSIS — E559 Vitamin D deficiency, unspecified: Secondary | ICD-10-CM | POA: Diagnosis not present

## 2015-08-06 DIAGNOSIS — N529 Male erectile dysfunction, unspecified: Secondary | ICD-10-CM | POA: Diagnosis not present

## 2015-08-06 DIAGNOSIS — E785 Hyperlipidemia, unspecified: Secondary | ICD-10-CM | POA: Diagnosis not present

## 2015-08-06 DIAGNOSIS — L309 Dermatitis, unspecified: Secondary | ICD-10-CM | POA: Diagnosis not present

## 2015-08-06 DIAGNOSIS — K219 Gastro-esophageal reflux disease without esophagitis: Secondary | ICD-10-CM | POA: Diagnosis not present

## 2015-08-06 DIAGNOSIS — M542 Cervicalgia: Secondary | ICD-10-CM | POA: Diagnosis not present

## 2015-08-06 MED FILL — predniSONE 20 MG TABS: 20 | 7 days supply | Qty: 7 | Fill #0

## 2015-08-06 MED FILL — MELOXICAM 15 MG TABLET: 15 | 30 days supply | Qty: 30 | Fill #0

## 2015-08-08 MED FILL — VIAGRA 50 MG TABLET: 50 | 30 days supply | Qty: 6 | Fill #0

## 2015-08-18 DIAGNOSIS — Z131 Encounter for screening for diabetes mellitus: Secondary | ICD-10-CM | POA: Diagnosis not present

## 2015-08-18 DIAGNOSIS — Z136 Encounter for screening for cardiovascular disorders: Secondary | ICD-10-CM | POA: Diagnosis not present

## 2015-08-18 DIAGNOSIS — Z0000 Encounter for general adult medical examination without abnormal findings: Secondary | ICD-10-CM | POA: Diagnosis not present

## 2015-08-18 DIAGNOSIS — Z01118 Encounter for examination of ears and hearing with other abnormal findings: Secondary | ICD-10-CM | POA: Diagnosis not present

## 2015-08-18 DIAGNOSIS — Z1389 Encounter for screening for other disorder: Secondary | ICD-10-CM | POA: Diagnosis not present

## 2015-08-18 DIAGNOSIS — Z0100 Encounter for examination of eyes and vision without abnormal findings: Secondary | ICD-10-CM | POA: Diagnosis not present

## 2015-11-13 DIAGNOSIS — F524 Premature ejaculation: Secondary | ICD-10-CM | POA: Diagnosis not present

## 2015-11-13 DIAGNOSIS — E559 Vitamin D deficiency, unspecified: Secondary | ICD-10-CM | POA: Diagnosis not present

## 2015-11-13 DIAGNOSIS — L298 Other pruritus: Secondary | ICD-10-CM | POA: Diagnosis not present

## 2015-11-13 DIAGNOSIS — L309 Dermatitis, unspecified: Secondary | ICD-10-CM | POA: Diagnosis not present

## 2015-11-13 DIAGNOSIS — R0602 Shortness of breath: Secondary | ICD-10-CM | POA: Diagnosis not present

## 2015-11-13 DIAGNOSIS — N529 Male erectile dysfunction, unspecified: Secondary | ICD-10-CM | POA: Diagnosis not present

## 2015-11-13 DIAGNOSIS — E785 Hyperlipidemia, unspecified: Secondary | ICD-10-CM | POA: Diagnosis not present

## 2015-11-13 DIAGNOSIS — I739 Peripheral vascular disease, unspecified: Secondary | ICD-10-CM | POA: Diagnosis not present

## 2015-11-13 DIAGNOSIS — K219 Gastro-esophageal reflux disease without esophagitis: Secondary | ICD-10-CM | POA: Diagnosis not present

## 2016-02-11 DIAGNOSIS — H524 Presbyopia: Secondary | ICD-10-CM | POA: Diagnosis not present

## 2016-04-16 DIAGNOSIS — L309 Dermatitis, unspecified: Secondary | ICD-10-CM | POA: Diagnosis not present

## 2016-04-16 DIAGNOSIS — E559 Vitamin D deficiency, unspecified: Secondary | ICD-10-CM | POA: Diagnosis not present

## 2016-04-16 DIAGNOSIS — E785 Hyperlipidemia, unspecified: Secondary | ICD-10-CM | POA: Diagnosis not present

## 2016-04-16 DIAGNOSIS — R1032 Left lower quadrant pain: Secondary | ICD-10-CM | POA: Diagnosis not present

## 2016-04-16 DIAGNOSIS — K219 Gastro-esophageal reflux disease without esophagitis: Secondary | ICD-10-CM | POA: Diagnosis not present

## 2016-04-23 MED FILL — IBUPROFEN 600 MG TABLET: 600 | 7 days supply | Qty: 30 | Fill #0

## 2016-04-23 MED FILL — ACETAMINOPHEN/COD #3 TABLET: 300-30 | 2 days supply | Qty: 12 | Fill #0

## 2016-06-10 MED FILL — SF 5000 PLUS CREAM: 1.1 | 30 days supply | Qty: 51 | Fill #0

## 2016-07-28 DIAGNOSIS — Z131 Encounter for screening for diabetes mellitus: Secondary | ICD-10-CM | POA: Diagnosis not present

## 2016-07-28 DIAGNOSIS — Z113 Encounter for screening for infections with a predominantly sexual mode of transmission: Secondary | ICD-10-CM | POA: Diagnosis not present

## 2016-07-28 DIAGNOSIS — Z136 Encounter for screening for cardiovascular disorders: Secondary | ICD-10-CM | POA: Diagnosis not present

## 2016-07-28 DIAGNOSIS — Z01118 Encounter for examination of ears and hearing with other abnormal findings: Secondary | ICD-10-CM | POA: Diagnosis not present

## 2016-07-28 DIAGNOSIS — Z0000 Encounter for general adult medical examination without abnormal findings: Secondary | ICD-10-CM | POA: Diagnosis not present

## 2016-07-28 DIAGNOSIS — H538 Other visual disturbances: Secondary | ICD-10-CM | POA: Diagnosis not present

## 2016-08-11 ENCOUNTER — Other Ambulatory Visit: Payer: Self-pay | Admitting: Internal Medicine

## 2016-08-11 DIAGNOSIS — L309 Dermatitis, unspecified: Secondary | ICD-10-CM | POA: Diagnosis not present

## 2016-08-11 DIAGNOSIS — E559 Vitamin D deficiency, unspecified: Secondary | ICD-10-CM | POA: Diagnosis not present

## 2016-08-11 DIAGNOSIS — E785 Hyperlipidemia, unspecified: Secondary | ICD-10-CM | POA: Diagnosis not present

## 2016-08-11 DIAGNOSIS — K219 Gastro-esophageal reflux disease without esophagitis: Secondary | ICD-10-CM | POA: Diagnosis not present

## 2016-08-11 DIAGNOSIS — L0292 Furuncle, unspecified: Secondary | ICD-10-CM | POA: Diagnosis not present

## 2016-08-11 MED FILL — CEPHALEXIN 500 MG CAPSULE: 500 | 10 days supply | Qty: 40 | Fill #0

## 2016-08-13 ENCOUNTER — Other Ambulatory Visit: Payer: Self-pay | Admitting: Internal Medicine

## 2016-08-13 DIAGNOSIS — R1030 Lower abdominal pain, unspecified: Secondary | ICD-10-CM

## 2016-08-19 ENCOUNTER — Ambulatory Visit
Admission: RE | Admit: 2016-08-19 | Discharge: 2016-08-19 | Disposition: A | Discharge status: Home / Self Care | Payer: 59 | Source: Ambulatory Visit | Attending: Internal Medicine | Admitting: Internal Medicine

## 2016-08-19 DIAGNOSIS — R1030 Lower abdominal pain, unspecified: Secondary | ICD-10-CM

## 2016-08-19 DIAGNOSIS — N50812 Left testicular pain: Secondary | ICD-10-CM | POA: Diagnosis not present

## 2016-10-06 DIAGNOSIS — E559 Vitamin D deficiency, unspecified: Secondary | ICD-10-CM | POA: Diagnosis not present

## 2016-10-06 DIAGNOSIS — E785 Hyperlipidemia, unspecified: Secondary | ICD-10-CM | POA: Diagnosis not present

## 2016-10-06 DIAGNOSIS — K219 Gastro-esophageal reflux disease without esophagitis: Secondary | ICD-10-CM | POA: Diagnosis not present

## 2016-10-06 DIAGNOSIS — N50812 Left testicular pain: Secondary | ICD-10-CM | POA: Diagnosis not present

## 2016-10-06 DIAGNOSIS — G629 Polyneuropathy, unspecified: Secondary | ICD-10-CM | POA: Diagnosis not present

## 2016-10-06 MED FILL — GABAPENTIN 300 MG CAPSULE: 300 | 30 days supply | Qty: 30 | Fill #0

## 2016-11-09 MED FILL — AZITHROMYCIN 500 MG TABLET: 500 | 6 days supply | Qty: 6 | Fill #0

## 2016-11-09 MED FILL — ATOVAQUONE-PROGUANIL 250-10: 250-100 | 46 days supply | Qty: 46 | Fill #0

## 2017-06-29 DIAGNOSIS — H524 Presbyopia: Secondary | ICD-10-CM | POA: Diagnosis not present

## 2017-08-25 DIAGNOSIS — Z136 Encounter for screening for cardiovascular disorders: Secondary | ICD-10-CM | POA: Diagnosis not present

## 2017-08-25 DIAGNOSIS — G629 Polyneuropathy, unspecified: Secondary | ICD-10-CM | POA: Diagnosis not present

## 2017-08-25 DIAGNOSIS — Z0000 Encounter for general adult medical examination without abnormal findings: Secondary | ICD-10-CM | POA: Diagnosis not present

## 2017-08-25 DIAGNOSIS — H538 Other visual disturbances: Secondary | ICD-10-CM | POA: Diagnosis not present

## 2017-08-25 DIAGNOSIS — Z131 Encounter for screening for diabetes mellitus: Secondary | ICD-10-CM | POA: Diagnosis not present

## 2017-08-25 DIAGNOSIS — Z125 Encounter for screening for malignant neoplasm of prostate: Secondary | ICD-10-CM | POA: Diagnosis not present

## 2017-08-25 DIAGNOSIS — K219 Gastro-esophageal reflux disease without esophagitis: Secondary | ICD-10-CM | POA: Diagnosis not present

## 2017-08-25 DIAGNOSIS — E559 Vitamin D deficiency, unspecified: Secondary | ICD-10-CM | POA: Diagnosis not present

## 2017-08-25 DIAGNOSIS — Z5181 Encounter for therapeutic drug level monitoring: Secondary | ICD-10-CM | POA: Diagnosis not present

## 2017-08-25 DIAGNOSIS — Z01118 Encounter for examination of ears and hearing with other abnormal findings: Secondary | ICD-10-CM | POA: Diagnosis not present

## 2017-08-25 DIAGNOSIS — E785 Hyperlipidemia, unspecified: Secondary | ICD-10-CM | POA: Diagnosis not present

## 2017-08-25 DIAGNOSIS — Z113 Encounter for screening for infections with a predominantly sexual mode of transmission: Secondary | ICD-10-CM | POA: Diagnosis not present

## 2017-09-07 DIAGNOSIS — Z1211 Encounter for screening for malignant neoplasm of colon: Secondary | ICD-10-CM | POA: Diagnosis not present

## 2017-09-14 MED FILL — PLENVU 140 GM SOLR: 140 | 2 days supply | Qty: 3 | Fill #0

## 2017-09-16 DIAGNOSIS — E785 Hyperlipidemia, unspecified: Secondary | ICD-10-CM | POA: Diagnosis not present

## 2017-09-16 DIAGNOSIS — G629 Polyneuropathy, unspecified: Secondary | ICD-10-CM | POA: Diagnosis not present

## 2017-09-16 DIAGNOSIS — M542 Cervicalgia: Secondary | ICD-10-CM | POA: Diagnosis not present

## 2017-09-16 DIAGNOSIS — E559 Vitamin D deficiency, unspecified: Secondary | ICD-10-CM | POA: Diagnosis not present

## 2017-09-16 DIAGNOSIS — K219 Gastro-esophageal reflux disease without esophagitis: Secondary | ICD-10-CM | POA: Diagnosis not present

## 2017-09-27 DIAGNOSIS — K635 Polyp of colon: Secondary | ICD-10-CM | POA: Diagnosis not present

## 2017-09-27 DIAGNOSIS — D123 Benign neoplasm of transverse colon: Secondary | ICD-10-CM | POA: Diagnosis not present

## 2017-09-27 DIAGNOSIS — Z1211 Encounter for screening for malignant neoplasm of colon: Secondary | ICD-10-CM | POA: Diagnosis not present

## 2018-06-09 DIAGNOSIS — E7800 Pure hypercholesterolemia, unspecified: Secondary | ICD-10-CM | POA: Diagnosis not present

## 2018-06-09 DIAGNOSIS — B351 Tinea unguium: Secondary | ICD-10-CM | POA: Diagnosis not present

## 2018-06-09 DIAGNOSIS — G629 Polyneuropathy, unspecified: Secondary | ICD-10-CM | POA: Diagnosis not present

## 2018-06-09 DIAGNOSIS — E559 Vitamin D deficiency, unspecified: Secondary | ICD-10-CM | POA: Diagnosis not present

## 2018-06-09 DIAGNOSIS — K219 Gastro-esophageal reflux disease without esophagitis: Secondary | ICD-10-CM | POA: Diagnosis not present

## 2018-06-09 MED FILL — KETOCONAZOLE 2% CREAM: 2 | 30 days supply | Qty: 30 | Fill #0

## 2018-06-09 MED FILL — DICLOFENAC SODIUM 3% GEL: 3 | 90 days supply | Qty: 100 | Fill #0

## 2018-08-30 DIAGNOSIS — Z0100 Encounter for examination of eyes and vision without abnormal findings: Secondary | ICD-10-CM | POA: Diagnosis not present

## 2018-08-30 DIAGNOSIS — E7800 Pure hypercholesterolemia, unspecified: Secondary | ICD-10-CM | POA: Diagnosis not present

## 2018-08-30 DIAGNOSIS — Z131 Encounter for screening for diabetes mellitus: Secondary | ICD-10-CM | POA: Diagnosis not present

## 2018-08-30 DIAGNOSIS — Z125 Encounter for screening for malignant neoplasm of prostate: Secondary | ICD-10-CM | POA: Diagnosis not present

## 2018-08-30 DIAGNOSIS — K219 Gastro-esophageal reflux disease without esophagitis: Secondary | ICD-10-CM | POA: Diagnosis not present

## 2018-08-30 DIAGNOSIS — Z0110 Encounter for examination of ears and hearing without abnormal findings: Secondary | ICD-10-CM | POA: Diagnosis not present

## 2018-08-30 DIAGNOSIS — Z0000 Encounter for general adult medical examination without abnormal findings: Secondary | ICD-10-CM | POA: Diagnosis not present

## 2018-08-30 DIAGNOSIS — Z113 Encounter for screening for infections with a predominantly sexual mode of transmission: Secondary | ICD-10-CM | POA: Diagnosis not present

## 2018-08-30 DIAGNOSIS — Z136 Encounter for screening for cardiovascular disorders: Secondary | ICD-10-CM | POA: Diagnosis not present

## 2018-08-30 MED FILL — MELOXICAM 15 MG TABLET: 15 | 30 days supply | Qty: 30 | Fill #0

## 2018-08-30 MED FILL — GABAPENTIN 300 MG CAPSULE: 300 | 30 days supply | Qty: 30 | Fill #0

## 2018-10-18 DIAGNOSIS — E7800 Pure hypercholesterolemia, unspecified: Secondary | ICD-10-CM | POA: Diagnosis not present

## 2018-10-18 DIAGNOSIS — G629 Polyneuropathy, unspecified: Secondary | ICD-10-CM | POA: Diagnosis not present

## 2018-10-18 DIAGNOSIS — E559 Vitamin D deficiency, unspecified: Secondary | ICD-10-CM | POA: Diagnosis not present

## 2018-10-18 DIAGNOSIS — K219 Gastro-esophageal reflux disease without esophagitis: Secondary | ICD-10-CM | POA: Diagnosis not present

## 2018-10-18 MED FILL — PREGABALIN 75 MG CAPS: 75 | 30 days supply | Qty: 60 | Fill #0

## 2018-12-20 DIAGNOSIS — G629 Polyneuropathy, unspecified: Secondary | ICD-10-CM | POA: Diagnosis not present

## 2018-12-20 DIAGNOSIS — E7800 Pure hypercholesterolemia, unspecified: Secondary | ICD-10-CM | POA: Diagnosis not present

## 2018-12-20 DIAGNOSIS — E559 Vitamin D deficiency, unspecified: Secondary | ICD-10-CM | POA: Diagnosis not present

## 2018-12-20 DIAGNOSIS — K219 Gastro-esophageal reflux disease without esophagitis: Secondary | ICD-10-CM | POA: Diagnosis not present

## 2018-12-20 DIAGNOSIS — M542 Cervicalgia: Secondary | ICD-10-CM | POA: Diagnosis not present

## 2018-12-20 DIAGNOSIS — I872 Venous insufficiency (chronic) (peripheral): Secondary | ICD-10-CM | POA: Diagnosis not present

## 2018-12-28 DIAGNOSIS — M25511 Pain in right shoulder: Secondary | ICD-10-CM | POA: Diagnosis not present

## 2018-12-28 DIAGNOSIS — M25512 Pain in left shoulder: Secondary | ICD-10-CM | POA: Diagnosis not present

## 2018-12-28 DIAGNOSIS — M5412 Radiculopathy, cervical region: Secondary | ICD-10-CM | POA: Diagnosis not present

## 2019-01-04 DIAGNOSIS — M542 Cervicalgia: Secondary | ICD-10-CM | POA: Diagnosis not present

## 2019-01-04 DIAGNOSIS — S91001A Unspecified open wound, right ankle, initial encounter: Secondary | ICD-10-CM | POA: Diagnosis not present

## 2019-01-16 DIAGNOSIS — M5030 Other cervical disc degeneration, unspecified cervical region: Secondary | ICD-10-CM | POA: Diagnosis not present

## 2019-01-17 DIAGNOSIS — E559 Vitamin D deficiency, unspecified: Secondary | ICD-10-CM | POA: Diagnosis not present

## 2019-01-17 DIAGNOSIS — G629 Polyneuropathy, unspecified: Secondary | ICD-10-CM | POA: Diagnosis not present

## 2019-01-17 DIAGNOSIS — B351 Tinea unguium: Secondary | ICD-10-CM | POA: Diagnosis not present

## 2019-01-17 DIAGNOSIS — I872 Venous insufficiency (chronic) (peripheral): Secondary | ICD-10-CM | POA: Diagnosis not present

## 2019-01-17 DIAGNOSIS — E7800 Pure hypercholesterolemia, unspecified: Secondary | ICD-10-CM | POA: Diagnosis not present

## 2019-01-17 DIAGNOSIS — M542 Cervicalgia: Secondary | ICD-10-CM | POA: Diagnosis not present

## 2019-01-17 DIAGNOSIS — K219 Gastro-esophageal reflux disease without esophagitis: Secondary | ICD-10-CM | POA: Diagnosis not present

## 2019-02-16 ENCOUNTER — Other Ambulatory Visit: Payer: Self-pay

## 2019-02-16 DIAGNOSIS — I872 Venous insufficiency (chronic) (peripheral): Secondary | ICD-10-CM

## 2019-02-20 ENCOUNTER — Ambulatory Visit (HOSPITAL_COMMUNITY): Payer: 59

## 2019-02-20 ENCOUNTER — Encounter: Payer: 59 | Admitting: Vascular Surgery

## 2019-04-18 DIAGNOSIS — E7800 Pure hypercholesterolemia, unspecified: Secondary | ICD-10-CM | POA: Diagnosis not present

## 2019-04-18 DIAGNOSIS — M25552 Pain in left hip: Secondary | ICD-10-CM | POA: Diagnosis not present

## 2019-04-18 DIAGNOSIS — G629 Polyneuropathy, unspecified: Secondary | ICD-10-CM | POA: Diagnosis not present

## 2019-04-18 DIAGNOSIS — E559 Vitamin D deficiency, unspecified: Secondary | ICD-10-CM | POA: Diagnosis not present

## 2019-04-18 DIAGNOSIS — K219 Gastro-esophageal reflux disease without esophagitis: Secondary | ICD-10-CM | POA: Diagnosis not present

## 2019-04-18 DIAGNOSIS — M542 Cervicalgia: Secondary | ICD-10-CM | POA: Diagnosis not present

## 2019-04-18 DIAGNOSIS — I872 Venous insufficiency (chronic) (peripheral): Secondary | ICD-10-CM | POA: Diagnosis not present

## 2019-04-18 MED FILL — MELOXICAM 15 MG TABLET: 15 | 30 days supply | Qty: 30 | Fill #0

## 2019-04-19 MED FILL — GABAPENTIN 300 MG CAPSULE: 300 | 30 days supply | Qty: 30 | Fill #0

## 2019-05-23 DIAGNOSIS — H524 Presbyopia: Secondary | ICD-10-CM | POA: Diagnosis not present

## 2019-08-09 DIAGNOSIS — Z131 Encounter for screening for diabetes mellitus: Secondary | ICD-10-CM | POA: Diagnosis not present

## 2019-08-09 DIAGNOSIS — Z125 Encounter for screening for malignant neoplasm of prostate: Secondary | ICD-10-CM | POA: Diagnosis not present

## 2019-08-09 DIAGNOSIS — K219 Gastro-esophageal reflux disease without esophagitis: Secondary | ICD-10-CM | POA: Diagnosis not present

## 2019-08-09 DIAGNOSIS — Z0100 Encounter for examination of eyes and vision without abnormal findings: Secondary | ICD-10-CM | POA: Diagnosis not present

## 2019-08-09 DIAGNOSIS — E7800 Pure hypercholesterolemia, unspecified: Secondary | ICD-10-CM | POA: Diagnosis not present

## 2019-08-09 DIAGNOSIS — Z0001 Encounter for general adult medical examination with abnormal findings: Secondary | ICD-10-CM | POA: Diagnosis not present

## 2019-08-09 DIAGNOSIS — I10 Essential (primary) hypertension: Secondary | ICD-10-CM | POA: Diagnosis not present

## 2019-08-09 DIAGNOSIS — Z136 Encounter for screening for cardiovascular disorders: Secondary | ICD-10-CM | POA: Diagnosis not present

## 2019-08-09 DIAGNOSIS — E559 Vitamin D deficiency, unspecified: Secondary | ICD-10-CM | POA: Diagnosis not present

## 2019-08-09 MED FILL — HYDROCHLOROTHIAZIDE 12.5 MG: 12.5 | 30 days supply | Qty: 30 | Fill #0

## 2019-09-03 DIAGNOSIS — E559 Vitamin D deficiency, unspecified: Secondary | ICD-10-CM | POA: Diagnosis not present

## 2019-09-03 DIAGNOSIS — G629 Polyneuropathy, unspecified: Secondary | ICD-10-CM | POA: Diagnosis not present

## 2019-09-03 DIAGNOSIS — I872 Venous insufficiency (chronic) (peripheral): Secondary | ICD-10-CM | POA: Diagnosis not present

## 2019-09-03 DIAGNOSIS — I10 Essential (primary) hypertension: Secondary | ICD-10-CM | POA: Diagnosis not present

## 2019-09-03 DIAGNOSIS — R42 Dizziness and giddiness: Secondary | ICD-10-CM | POA: Diagnosis not present

## 2019-09-03 DIAGNOSIS — E7800 Pure hypercholesterolemia, unspecified: Secondary | ICD-10-CM | POA: Diagnosis not present

## 2019-09-03 DIAGNOSIS — K219 Gastro-esophageal reflux disease without esophagitis: Secondary | ICD-10-CM | POA: Diagnosis not present

## 2019-09-05 MED FILL — MECLIZINE 25 MG TABLET: 25 | 15 days supply | Qty: 30 | Fill #0

## 2019-09-05 MED FILL — CARVEDILOL 3.125 MG TABLET: 3.125 | 30 days supply | Qty: 60 | Fill #0

## 2019-09-13 ENCOUNTER — Other Ambulatory Visit: Payer: Self-pay | Admitting: Sports Medicine

## 2019-09-13 DIAGNOSIS — M542 Cervicalgia: Secondary | ICD-10-CM

## 2019-09-18 ENCOUNTER — Ambulatory Visit
Admission: RE | Admit: 2019-09-18 | Discharge: 2019-09-18 | Disposition: A | Discharge status: Home / Self Care | Payer: 59 | Source: Ambulatory Visit | Attending: Sports Medicine | Admitting: Sports Medicine

## 2019-09-18 ENCOUNTER — Other Ambulatory Visit: Payer: Self-pay

## 2019-09-18 DIAGNOSIS — M542 Cervicalgia: Secondary | ICD-10-CM

## 2019-09-18 DIAGNOSIS — M47812 Spondylosis without myelopathy or radiculopathy, cervical region: Secondary | ICD-10-CM | POA: Diagnosis not present

## 2019-09-18 MED ORDER — IOPAMIDOL (ISOVUE-M 300) INJECTION 61%
1.0000 mL | Freq: Once | INTRAMUSCULAR | Status: AC
  Administered 2019-09-18: 1 mL via EPIDURAL

## 2019-09-18 MED ORDER — TRIAMCINOLONE ACETONIDE 40 MG/ML IJ SUSP (RADIOLOGY)
60.0000 mg | Freq: Once | INTRAMUSCULAR | Status: AC
  Administered 2019-09-18: 60 mg via EPIDURAL

## 2019-09-18 NOTE — Discharge Instructions (Signed)

## 2019-10-15 MED FILL — ATOVAQUONE-PROGUANIL 250-10: 250-100 | 49 days supply | Qty: 49 | Fill #0

## 2019-10-15 MED FILL — AZITHROMYCIN 500 MG TABS: 500 | 3 days supply | Qty: 4 | Fill #0

## 2020-08-14 IMAGING — XA DG INJECT/[PERSON_NAME] INC NEEDLE/CATH/PLC EPI/CERV/THOR W/IMG
2 series · 2 of 2 positions shown · non-contrast
Comparison: none

CLINICAL DATA: Spondylosis without myelopathy. Bilateral arm and
hand numbness and tingling at night.

[Series 1: ortho standard · 1 of 1 slices shown (1 of 2)]
[im 1/1]
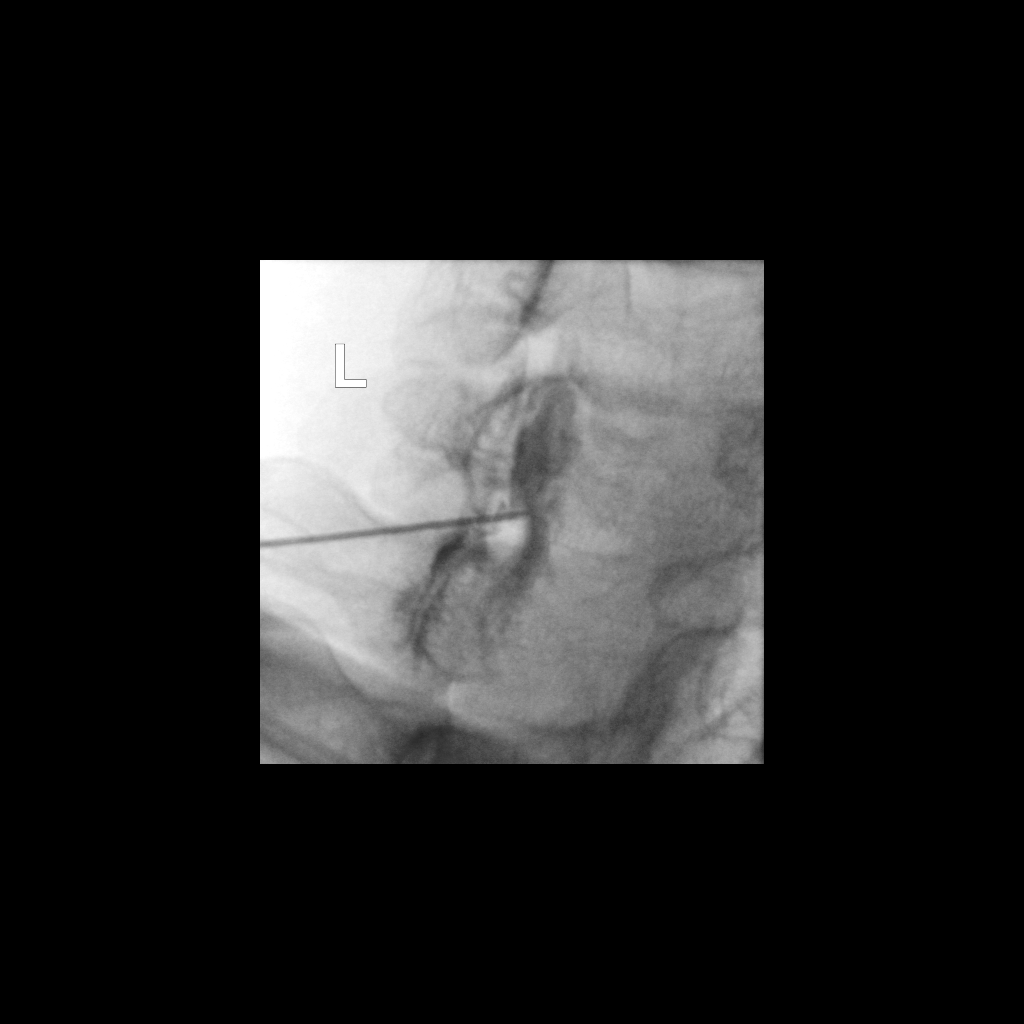

[Series 2: ortho standard · 1 of 1 slices shown (2 of 2)]
[im 1/1]
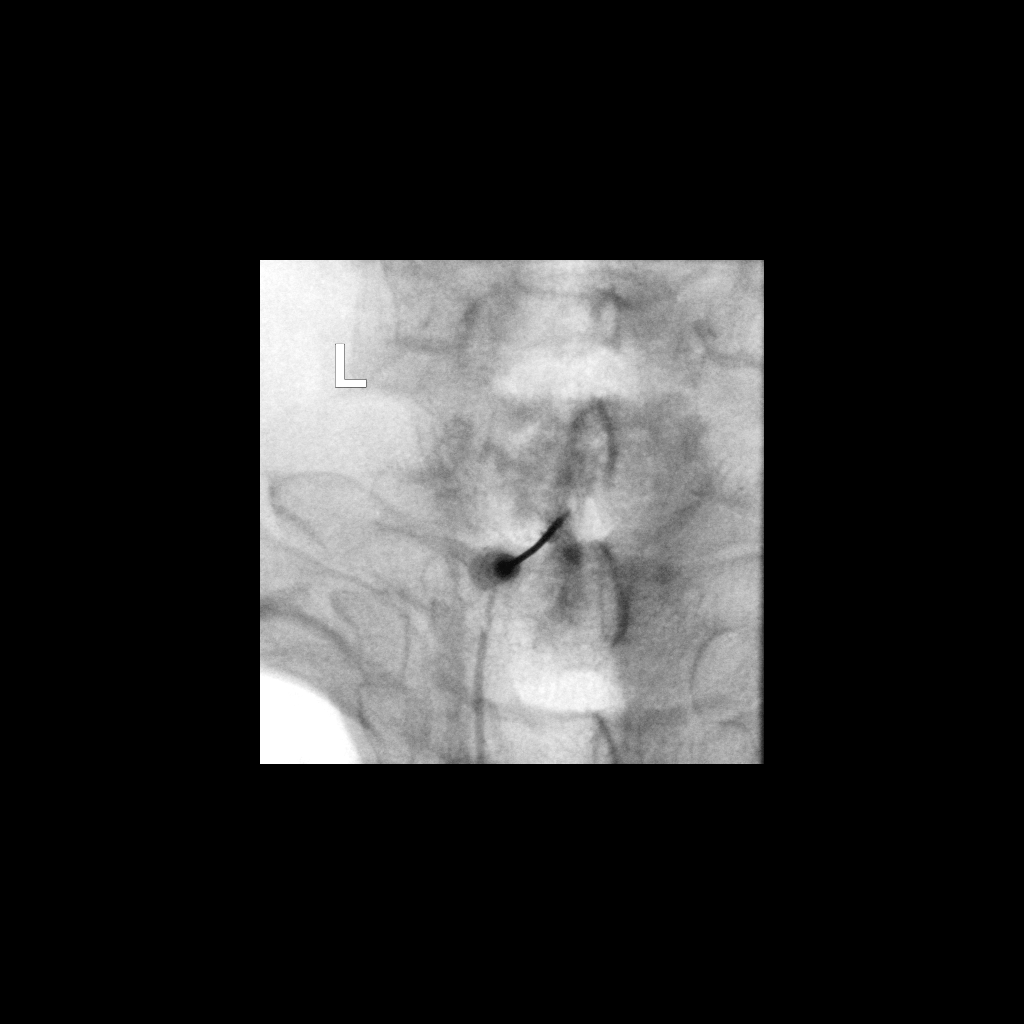

[2 of 2 positions shown; findings below may reference images not displayed]

FLUOROSCOPY TIME:  1 minutes 46 seconds. 45.33 micro gray meter
squared

PROCEDURE:
CERVICAL EPIDURAL INJECTION

An interlaminar approach was performed on the left at C7-T1. No
Monsef, Nokiaa right-sided approach was taken but there is a large lamina
on the right that blocked access. A 20 gauge epidural needle was
advanced using loss-of-resistance technique.

DIAGNOSTIC EPIDURAL INJECTION

Injection of Isovue-M 300 shows a good epidural pattern with spread
above and below the level of needle placement, spreading to both
sides. No vascular opacification is seen. THERAPEUTIC

EPIDURAL INJECTION

1.5 ml of Kenalog 40 mixed with 1 ml of 1% Lidocaine and 2 ml of
normal saline were then instilled. The procedure was well-tolerated,
and the patient was discharged thirty minutes following the
injection in good condition.
IMPRESSION: Technically successful initial epidural injection on the left at
C7-T1.

## 2021-05-27 ENCOUNTER — Other Ambulatory Visit (HOSPITAL_COMMUNITY): Payer: Self-pay

## 2021-05-27 DIAGNOSIS — Z298 Encounter for other specified prophylactic measures: Secondary | ICD-10-CM | POA: Diagnosis not present

## 2021-05-27 DIAGNOSIS — B351 Tinea unguium: Secondary | ICD-10-CM | POA: Diagnosis not present

## 2021-05-27 DIAGNOSIS — I10 Essential (primary) hypertension: Secondary | ICD-10-CM | POA: Diagnosis not present

## 2021-05-27 DIAGNOSIS — J4520 Mild intermittent asthma, uncomplicated: Secondary | ICD-10-CM | POA: Diagnosis not present

## 2021-05-27 MED ORDER — DOXYCYCLINE HYCLATE 100 MG PO TABS
ORAL_TABLET | ORAL | 0 refills | Status: AC
  Filled 2021-05-27: qty 60, 60d supply, fill #0

## 2021-05-27 MED ORDER — TERBINAFINE HCL 250 MG PO TABS
250.0000 mg | ORAL_TABLET | Freq: Every day | ORAL | 0 refills | Status: AC
  Filled 2021-05-27: qty 42, 42d supply, fill #0

## 2021-05-27 MED ORDER — ALBUTEROL SULFATE HFA 108 (90 BASE) MCG/ACT IN AERS
INHALATION_SPRAY | RESPIRATORY_TRACT | 0 refills | Status: AC
  Filled 2021-05-27: qty 8.5, 25d supply, fill #0

## 2022-09-06 DIAGNOSIS — Z0001 Encounter for general adult medical examination with abnormal findings: Secondary | ICD-10-CM | POA: Diagnosis not present

## 2022-09-06 DIAGNOSIS — E7800 Pure hypercholesterolemia, unspecified: Secondary | ICD-10-CM | POA: Diagnosis not present

## 2022-09-06 DIAGNOSIS — Z136 Encounter for screening for cardiovascular disorders: Secondary | ICD-10-CM | POA: Diagnosis not present

## 2022-09-06 DIAGNOSIS — I10 Essential (primary) hypertension: Secondary | ICD-10-CM | POA: Diagnosis not present

## 2022-09-06 DIAGNOSIS — Z131 Encounter for screening for diabetes mellitus: Secondary | ICD-10-CM | POA: Diagnosis not present

## 2022-09-06 DIAGNOSIS — E559 Vitamin D deficiency, unspecified: Secondary | ICD-10-CM | POA: Diagnosis not present

## 2022-09-06 DIAGNOSIS — J4520 Mild intermittent asthma, uncomplicated: Secondary | ICD-10-CM | POA: Diagnosis not present

## 2022-09-06 DIAGNOSIS — Z125 Encounter for screening for malignant neoplasm of prostate: Secondary | ICD-10-CM | POA: Diagnosis not present

## 2022-09-06 DIAGNOSIS — K219 Gastro-esophageal reflux disease without esophagitis: Secondary | ICD-10-CM | POA: Diagnosis not present

## 2022-10-04 ENCOUNTER — Other Ambulatory Visit (HOSPITAL_COMMUNITY): Payer: Self-pay

## 2022-10-04 DIAGNOSIS — J4520 Mild intermittent asthma, uncomplicated: Secondary | ICD-10-CM | POA: Diagnosis not present

## 2022-10-04 DIAGNOSIS — E559 Vitamin D deficiency, unspecified: Secondary | ICD-10-CM | POA: Diagnosis not present

## 2022-10-04 DIAGNOSIS — E7800 Pure hypercholesterolemia, unspecified: Secondary | ICD-10-CM | POA: Diagnosis not present

## 2022-10-04 DIAGNOSIS — I10 Essential (primary) hypertension: Secondary | ICD-10-CM | POA: Diagnosis not present

## 2022-10-04 MED ORDER — CARVEDILOL 3.125 MG PO TABS
3.1250 mg | ORAL_TABLET | Freq: Two times a day (BID) | ORAL | 0 refills | Status: AC
  Filled 2022-10-04: qty 180, 90d supply, fill #0

## 2022-10-04 MED ORDER — VITAMIN D (ERGOCALCIFEROL) 50 MCG (2000 UT) PO CAPS: 2000.0000 [IU] | ORAL_CAPSULE | Freq: Every day | ORAL | 1 refills | Status: AC

## 2022-10-12 ENCOUNTER — Other Ambulatory Visit (HOSPITAL_COMMUNITY): Payer: Self-pay

## 2022-10-13 ENCOUNTER — Other Ambulatory Visit (HOSPITAL_COMMUNITY): Payer: Self-pay

## 2022-12-03 ENCOUNTER — Other Ambulatory Visit (HOSPITAL_COMMUNITY): Payer: Self-pay

## 2022-12-03 DIAGNOSIS — M542 Cervicalgia: Secondary | ICD-10-CM | POA: Diagnosis not present

## 2022-12-03 MED ORDER — MELOXICAM 15 MG PO TABS
ORAL_TABLET | ORAL | 1 refills | Status: AC
  Filled 2022-12-03: qty 30, 30d supply, fill #0

## 2022-12-10 DIAGNOSIS — M542 Cervicalgia: Secondary | ICD-10-CM | POA: Diagnosis not present

## 2022-12-13 ENCOUNTER — Other Ambulatory Visit (HOSPITAL_COMMUNITY): Payer: Self-pay

## 2022-12-27 ENCOUNTER — Other Ambulatory Visit (HOSPITAL_COMMUNITY): Payer: Self-pay

## 2022-12-27 DIAGNOSIS — M542 Cervicalgia: Secondary | ICD-10-CM | POA: Diagnosis not present

## 2022-12-27 MED ORDER — GABAPENTIN 300 MG PO CAPS
300.0000 mg | ORAL_CAPSULE | Freq: Every day | ORAL | 0 refills | Status: AC
  Filled 2022-12-27: qty 90, 90d supply, fill #0

## 2022-12-31 ENCOUNTER — Other Ambulatory Visit (HOSPITAL_COMMUNITY): Payer: Self-pay

## 2022-12-31 MED ORDER — ATOVAQUONE-PROGUANIL HCL 250-100 MG PO TABS
1.0000 | ORAL_TABLET | Freq: Every day | ORAL | 0 refills | Status: AC
  Filled 2022-12-31: qty 10, 10d supply, fill #0

## 2023-01-05 ENCOUNTER — Other Ambulatory Visit (HOSPITAL_COMMUNITY): Payer: Self-pay

## 2023-01-10 ENCOUNTER — Other Ambulatory Visit (HOSPITAL_COMMUNITY): Payer: Self-pay

## 2023-03-22 ENCOUNTER — Other Ambulatory Visit (HOSPITAL_COMMUNITY): Payer: Self-pay

## 2023-03-22 DIAGNOSIS — H40013 Open angle with borderline findings, low risk, bilateral: Secondary | ICD-10-CM | POA: Diagnosis not present

## 2023-03-22 MED ORDER — LATANOPROST 0.005 % OP SOLN
1.0000 [drp] | Freq: Every evening | OPHTHALMIC | 5 refills | Status: AC
  Filled 2023-03-22: qty 5, 50d supply, fill #0

## 2023-03-23 ENCOUNTER — Other Ambulatory Visit (HOSPITAL_COMMUNITY): Payer: Self-pay

## 2023-08-25 DIAGNOSIS — Z131 Encounter for screening for diabetes mellitus: Secondary | ICD-10-CM | POA: Diagnosis not present

## 2023-08-25 DIAGNOSIS — E7800 Pure hypercholesterolemia, unspecified: Secondary | ICD-10-CM | POA: Diagnosis not present

## 2023-08-25 DIAGNOSIS — I10 Essential (primary) hypertension: Secondary | ICD-10-CM | POA: Diagnosis not present

## 2023-08-25 DIAGNOSIS — Z0001 Encounter for general adult medical examination with abnormal findings: Secondary | ICD-10-CM | POA: Diagnosis not present

## 2023-08-25 DIAGNOSIS — G629 Polyneuropathy, unspecified: Secondary | ICD-10-CM | POA: Diagnosis not present

## 2023-08-25 DIAGNOSIS — K219 Gastro-esophageal reflux disease without esophagitis: Secondary | ICD-10-CM | POA: Diagnosis not present

## 2023-08-25 DIAGNOSIS — E559 Vitamin D deficiency, unspecified: Secondary | ICD-10-CM | POA: Diagnosis not present

## 2023-08-25 DIAGNOSIS — J4520 Mild intermittent asthma, uncomplicated: Secondary | ICD-10-CM | POA: Diagnosis not present

## 2024-01-13 ENCOUNTER — Other Ambulatory Visit (HOSPITAL_BASED_OUTPATIENT_CLINIC_OR_DEPARTMENT_OTHER): Payer: Self-pay

## 2024-01-13 ENCOUNTER — Other Ambulatory Visit (HOSPITAL_COMMUNITY): Payer: Self-pay

## 2024-01-13 DIAGNOSIS — H40053 Ocular hypertension, bilateral: Secondary | ICD-10-CM | POA: Diagnosis not present

## 2024-01-13 MED ORDER — LATANOPROST 0.005 % OP SOLN
1.0000 [drp] | Freq: Every evening | OPHTHALMIC | 11 refills | Status: AC
  Filled 2024-01-13: qty 5, 50d supply, fill #0

## 2024-04-04 ENCOUNTER — Other Ambulatory Visit (HOSPITAL_COMMUNITY): Payer: Self-pay

## 2024-04-04 ENCOUNTER — Other Ambulatory Visit: Payer: Self-pay

## 2024-04-04 DIAGNOSIS — H40013 Open angle with borderline findings, low risk, bilateral: Secondary | ICD-10-CM | POA: Diagnosis not present

## 2024-04-04 MED ORDER — LATANOPROST 0.005 % OP SOLN
1.0000 [drp] | Freq: Every evening | OPHTHALMIC | 4 refills | Status: AC
  Filled 2024-04-04: qty 2.5, 25d supply, fill #0
  Filled 2024-05-30: qty 2.5, 25d supply, fill #1
  Filled 2024-07-11 (×2): qty 2.5, 25d supply, fill #2

## 2024-05-30 ENCOUNTER — Other Ambulatory Visit (HOSPITAL_COMMUNITY): Payer: Self-pay

## 2024-07-11 ENCOUNTER — Other Ambulatory Visit (HOSPITAL_COMMUNITY): Payer: Self-pay

## 2024-07-11 ENCOUNTER — Other Ambulatory Visit: Payer: Self-pay

## 2024-07-12 ENCOUNTER — Other Ambulatory Visit (HOSPITAL_COMMUNITY): Payer: Self-pay
